# Patient Record
Sex: Female | Born: 1965 | Race: Black or African American | Hispanic: No | Marital: Single | Smoking: Current some day smoker
Health system: Southern US, Community
[De-identification: ages and names within clinical notes are randomized; demographics above are authoritative.]

## PROBLEM LIST (undated history)

## (undated) DIAGNOSIS — I10 Essential (primary) hypertension: Secondary | ICD-10-CM

---

## 2008-07-21 ENCOUNTER — Inpatient Hospital Stay: Payer: Self-pay | Admitting: Internal Medicine

## 2011-06-08 ENCOUNTER — Emergency Department: Payer: Self-pay | Admitting: *Deleted

## 2012-04-19 IMAGING — CR DG KNEE COMPLETE 4+V*L*
1 series · 5 of 5 positions shown · non-contrast
Comparison: none

REASON FOR EXAM: fall from ladder, left knee pain and swelling.
COMMENTS:

PROCEDURE:     DXR - DXR KNEE LT COMP WITH OBLIQUES  - June 08, 2011  [DATE]
RESULT:     Comparison: None.

[Series 4: t knee ap left · 0.14mm/px · 5 of 5 slices shown]
[im 1/5]
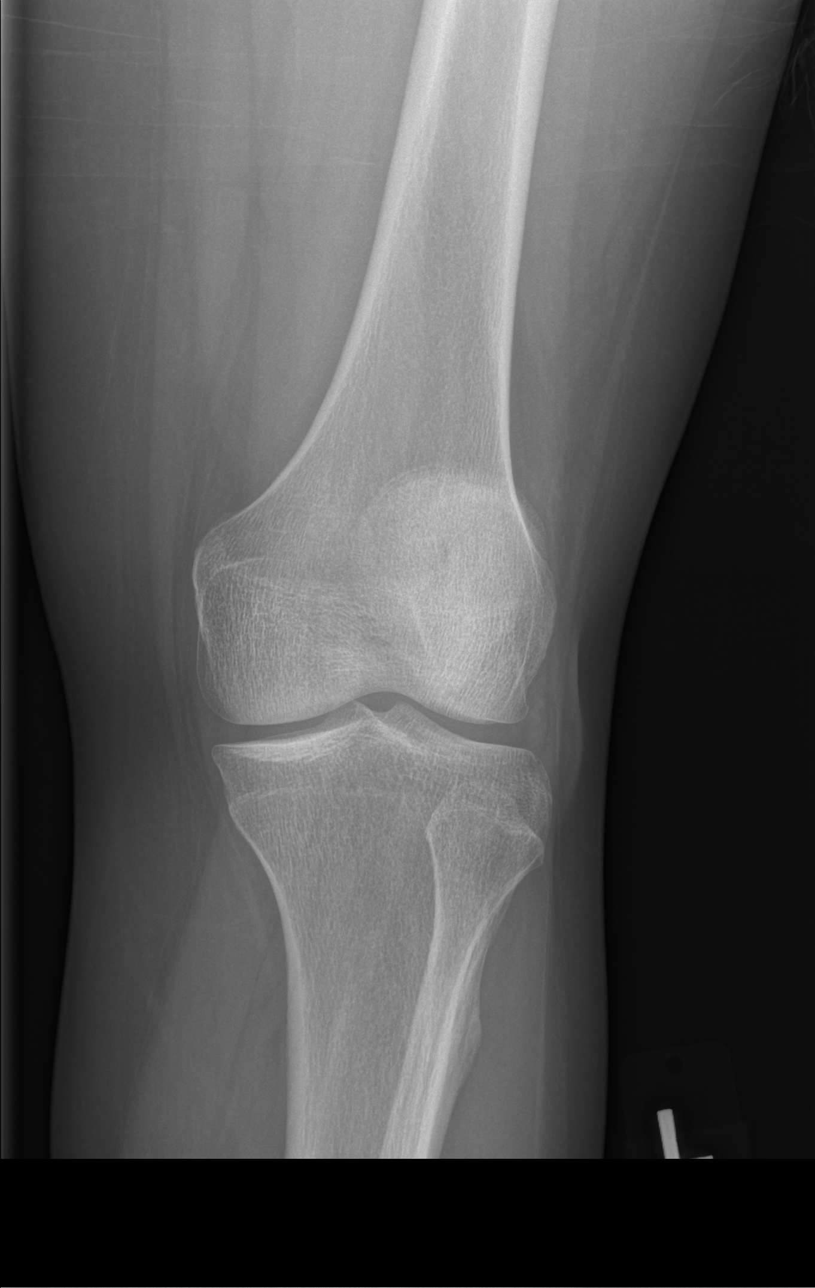
[im 2/5]
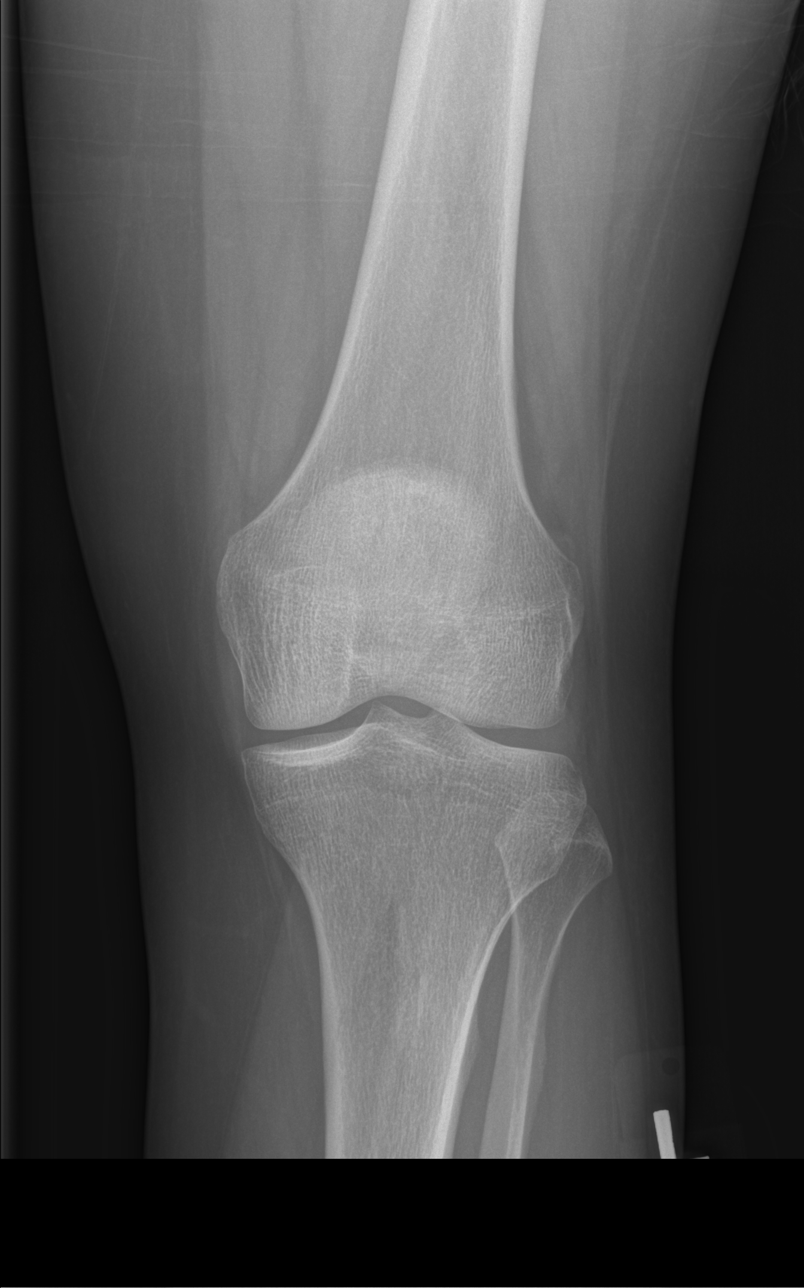
[im 3/5]
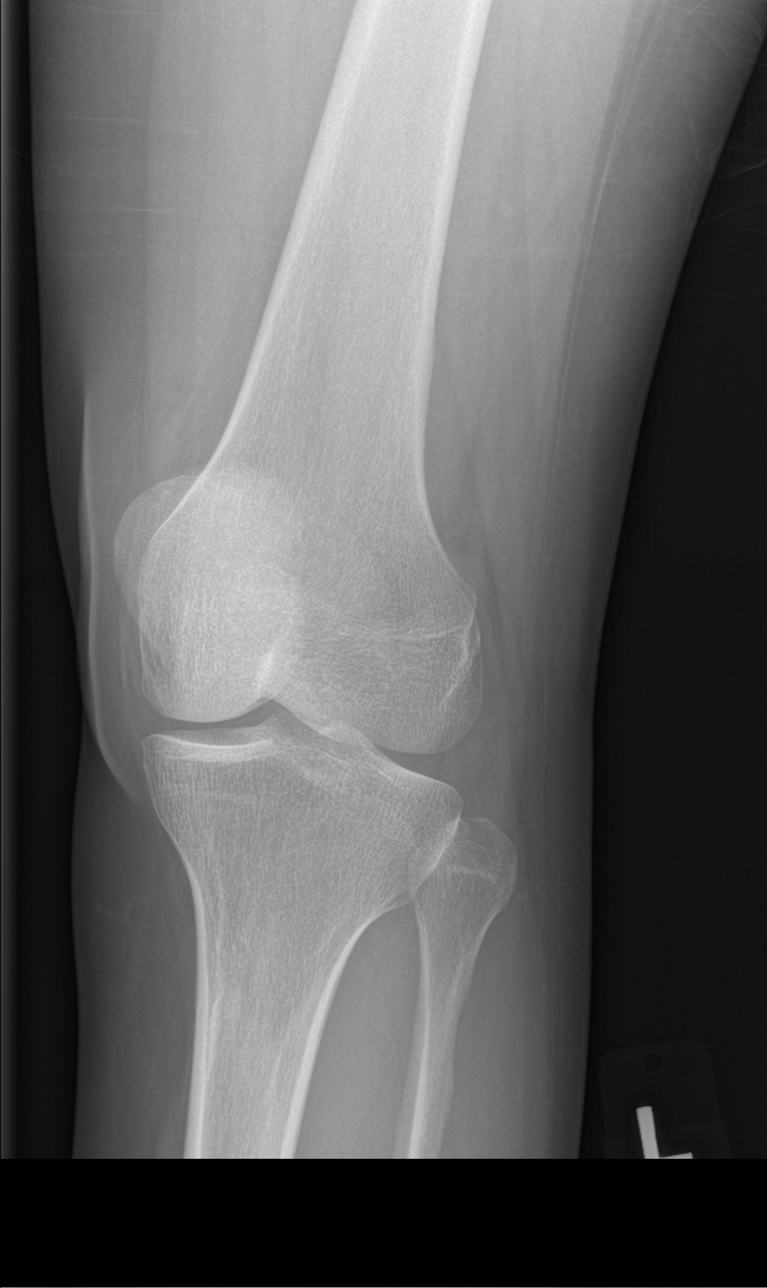
[im 4/5]
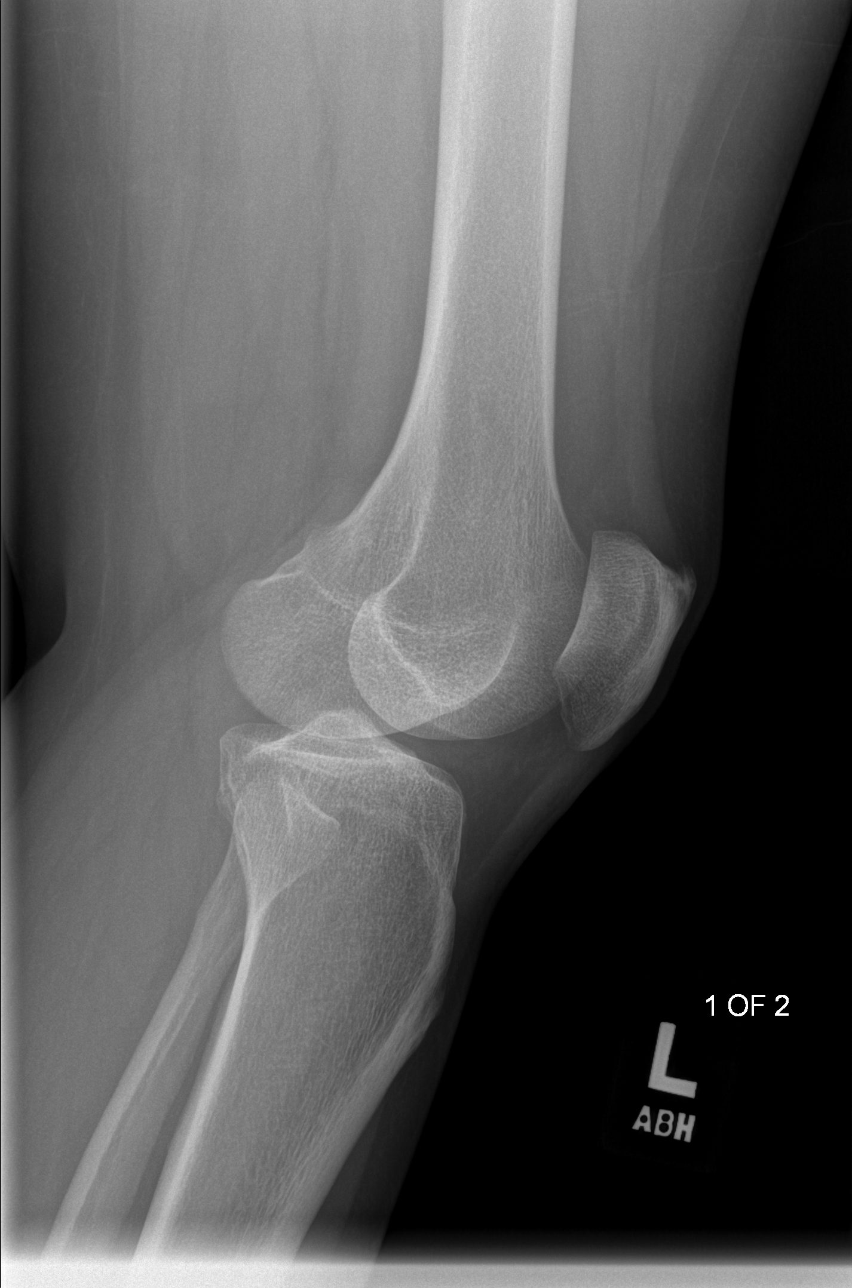
[im 5/5]
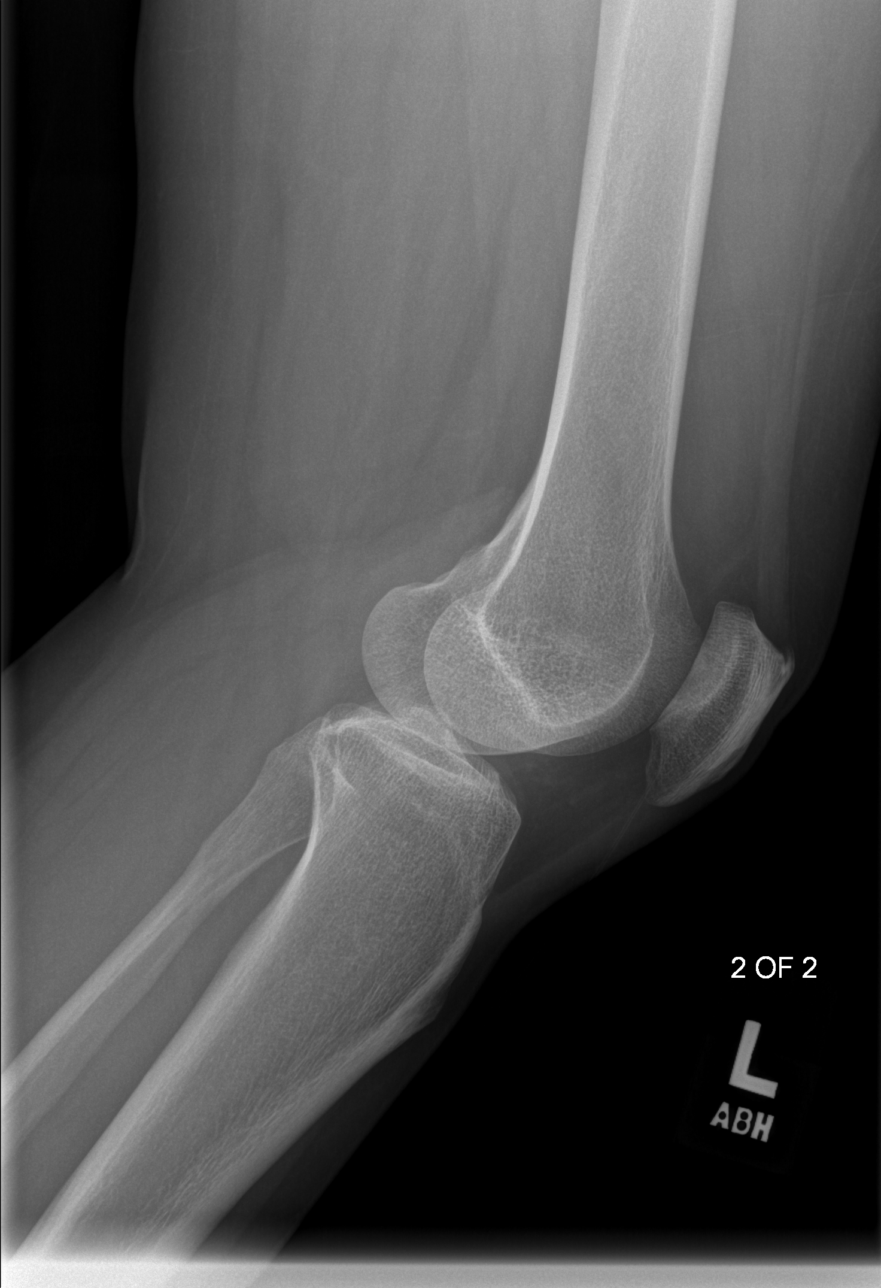

[5 of 5 positions shown; findings below may reference images not displayed]

FINDINGS: No acute fracture. Normal alignment. Joint spaces are maintained.
Indeterminate for effusion.
IMPRESSION: No fracture.

## 2015-10-07 ENCOUNTER — Emergency Department
Admission: EM | Admit: 2015-10-07 | Discharge: 2015-10-07 | Disposition: A | Discharge status: Home / Self Care | Payer: Self-pay | Attending: Emergency Medicine | Admitting: Emergency Medicine

## 2015-10-07 ENCOUNTER — Encounter: Payer: Self-pay | Admitting: Emergency Medicine

## 2015-10-07 ENCOUNTER — Emergency Department: Payer: Self-pay

## 2015-10-07 ENCOUNTER — Other Ambulatory Visit: Payer: Self-pay

## 2015-10-07 DIAGNOSIS — I10 Essential (primary) hypertension: Secondary | ICD-10-CM | POA: Insufficient documentation

## 2015-10-07 DIAGNOSIS — G629 Polyneuropathy, unspecified: Secondary | ICD-10-CM

## 2015-10-07 DIAGNOSIS — F17210 Nicotine dependence, cigarettes, uncomplicated: Secondary | ICD-10-CM | POA: Insufficient documentation

## 2015-10-07 DIAGNOSIS — G5692 Unspecified mononeuropathy of left upper limb: Secondary | ICD-10-CM | POA: Insufficient documentation

## 2015-10-07 HISTORY — DX: Essential (primary) hypertension: I10

## 2015-10-07 LAB — CBC
HCT: 44.7 % (ref 35.0–47.0)
Hemoglobin: 15.3 g/dL (ref 12.0–16.0)
MCH: 32 pg (ref 26.0–34.0)
MCHC: 34.3 g/dL (ref 32.0–36.0)
MCV: 93.3 fL (ref 80.0–100.0)
Platelets: 312 K/uL (ref 150–440)
RBC: 4.79 MIL/uL (ref 3.80–5.20)
RDW: 13.3 % (ref 11.5–14.5)
WBC: 10.3 K/uL (ref 3.6–11.0)

## 2015-10-07 LAB — COMPREHENSIVE METABOLIC PANEL
ALBUMIN: 4.5 g/dL (ref 3.5–5.0)
ALK PHOS: 96 U/L (ref 38–126)
ALT: 41 U/L (ref 14–54)
ANION GAP: 8 (ref 5–15)
AST: 39 U/L (ref 15–41)
BILIRUBIN TOTAL: 0.6 mg/dL (ref 0.3–1.2)
BUN: 15 mg/dL (ref 6–20)
CALCIUM: 9.5 mg/dL (ref 8.9–10.3)
CO2: 20 mmol/L — ABNORMAL LOW (ref 22–32)
Chloride: 111 mmol/L (ref 101–111)
Creatinine, Ser: 0.64 mg/dL (ref 0.44–1.00)
Glucose, Bld: 109 mg/dL — ABNORMAL HIGH (ref 65–99)
POTASSIUM: 3.9 mmol/L (ref 3.5–5.1)
Sodium: 139 mmol/L (ref 135–145)
TOTAL PROTEIN: 8.8 g/dL — AB (ref 6.5–8.1)

## 2015-10-07 NOTE — ED Provider Notes (Signed)
[facility] Emergency Department Provider Note  ____________________________________________    I have reviewed the triage vital signs and the nursing notes.   HISTORY  Chief Complaint Arm Pain    HPI Kayla Clarke is a 50 y.o. female who presents with complaints of tingling in her left arm which has now resolved. Apparently this occurred a few hours prior to arrival. Patient reports that she was sitting out in the sun drinking alcohol today as it is her first day off and she has had a lot of stress. She reports she felt very tired and had a tingling in her left arm. No chest pain. No weakness. In addition she complains of burning in her feet bilaterally, apparently this is been going on for some time. She denies headache. No history of diabetes     Past Medical History  Diagnosis Date  . Hypertension     There are no active problems to display for this patient.   History reviewed. No pertinent past surgical history.  No current outpatient prescriptions on file.  Allergies Review of patient's allergies indicates no known allergies.  No family history on file.  Social History Social History  Substance Use Topics  . Smoking status: Current Every Day Smoker -- 0.50 packs/day    Types: Cigarettes  . Smokeless tobacco: None  . Alcohol Use: 2.4 oz/week    4 Standard drinks or equivalent per week    Review of Systems  Constitutional: Positive for stress Eyes: Negative for change in vision ENT: Negative for sore throat Cardiovascular: Negative for chest pain Respiratory: Negative for shortness of breath. Gastrointestinal: Negative for abdominal pain Genitourinary: Negative for dysuria. Musculoskeletal: Chronic back pain Skin: Negative for rash. Neurological: Negative for focal weakness, no dizziness or headache Psychiatric: no anxiety    ____________________________________________   PHYSICAL EXAM:  VITAL SIGNS: ED Triage Vitals   Enc Vitals Group     BP 10/07/15 2018 162/90 mmHg     Pulse Rate 10/07/15 2018 79     Resp 10/07/15 2018 17     Temp 10/07/15 2018 97.8 F (36.6 C)     Temp Source 10/07/15 2018 Oral     SpO2 10/07/15 2018 99 %     Weight 10/07/15 2018 210 lb (95.255 kg)     Height 10/07/15 2018  (1.676 m)     Head Cir --      Peak Flow --      Pain Score 10/07/15 2028 5     Pain Loc --      Pain Edu? --      Excl. in GC? --     Constitutional: Alert and oriented. Well appearing and in no distress.  Eyes: Conjunctivae are normal. No erythema or injection, EOMI, PERRLA ENT   Head: Normocephalic and atraumatic.   Mouth/Throat: Mucous membranes are moist. Cardiovascular: Normal rate, regular rhythm. Normal and symmetric distal pulses are present in the upper extremities.  Respiratory: Normal respiratory effort without tachypnea nor retractions. Breath sounds are clear and equal bilaterally.  Gastrointestinal: Soft and non-tender in all quadrants. No distention. There is no CVA tenderness. Genitourinary: deferred Musculoskeletal: Nontender with normal range of motion in all extremities. No lower extremity tenderness nor edema. Neurologic:  Normal speech and language. No gross focal neurologic deficits are appreciated. Skin:  Skin is warm, dry and intact. No rash noted. Psychiatric: Mood and affect are normal. Patient exhibits appropriate insight and judgment.  ____________________________________________    LABS (pertinent positives/negatives)  Labs Reviewed  CBC  COMPREHENSIVE METABOLIC PANEL    ____________________________________________   EKG ED ECG REPORT I, Jene EveryKINNER, Avaneesh Pepitone, the attending physician, personally viewed and interpreted this ECG.  Date: 10/07/2015 EKG Time: 8:26 PM Rate: 83 Rhythm: normal sinus rhythm QRS Axis: normal Intervals: normal ST/T Wave abnormalities: normal Conduction Disturbances: none Narrative Interpretation:  unremarkable   ____________________________________________    RADIOLOGY  CT scan unremarkable  ____________________________________________   PROCEDURES  Procedure(s) performed: none  Critical Care performed:none  ____________________________________________   INITIAL IMPRESSION / ASSESSMENT AND PLAN / ED COURSE  Pertinent labs & imaging results that were available during my care of the patient were reviewed by me and considered in my medical decision making (see chart for details).  Patient presents in no acute distress. Her symptoms of all essentially resolved. History of present illness not consistent with CVA and exam is benign. She attributes this to stress and she may be correct. We will check labs, CT head and reevaluate  Patient is symptom-free and feels well. She is anxious to go home. Workup is unremarkable. Recommend follow-up with PCP, return to the ED for 6 symptoms ____________________________________________   FINAL CLINICAL IMPRESSION(S) / ED DIAGNOSES  Final diagnoses:  Neuropathy (HCC)                                                                                                               Jene Everyobert Anjelo Pullman, MD 10/07/15 2310

## 2015-10-07 NOTE — ED Notes (Signed)
MD at bedside. 

## 2015-10-07 NOTE — ED Notes (Signed)
Pt endorses increased life stress. Pt states she worked all day, without eating. Pt states she felt weak, L arm numbness, burning in bilateral feet. Pt states she suddenly felt very sleepy. Pt states she is presently very sleepy and her feet are continuing to burn.

## 2015-10-07 NOTE — ED Notes (Signed)
Patient reports that around 18:30 that she developed some numbness and tingling to her left arm. She reports that shortly after she developed tingling to bilateral feet. Patient reports that she often has the tingling in her feet after work. Patient with even smile, even grips and no drift noted in triage.

## 2015-10-07 NOTE — Discharge Instructions (Signed)

## 2022-06-29 ENCOUNTER — Emergency Department
Admission: EM | Admit: 2022-06-29 | Discharge: 2022-06-29 | Disposition: A | Discharge status: Home / Self Care | Payer: BC Managed Care – PPO | Attending: Emergency Medicine | Admitting: Emergency Medicine

## 2022-06-29 ENCOUNTER — Other Ambulatory Visit: Payer: Self-pay

## 2022-06-29 ENCOUNTER — Emergency Department: Payer: BC Managed Care – PPO

## 2022-06-29 DIAGNOSIS — Z20822 Contact with and (suspected) exposure to covid-19: Secondary | ICD-10-CM | POA: Insufficient documentation

## 2022-06-29 DIAGNOSIS — J101 Influenza due to other identified influenza virus with other respiratory manifestations: Secondary | ICD-10-CM | POA: Insufficient documentation

## 2022-06-29 DIAGNOSIS — I10 Essential (primary) hypertension: Secondary | ICD-10-CM | POA: Diagnosis not present

## 2022-06-29 DIAGNOSIS — R519 Headache, unspecified: Secondary | ICD-10-CM | POA: Diagnosis present

## 2022-06-29 DIAGNOSIS — R739 Hyperglycemia, unspecified: Secondary | ICD-10-CM

## 2022-06-29 LAB — CBC
HCT: 43 % (ref 36.0–46.0)
Hemoglobin: 14.5 g/dL (ref 12.0–15.0)
MCH: 31.3 pg (ref 26.0–34.0)
MCHC: 33.7 g/dL (ref 30.0–36.0)
MCV: 92.9 fL (ref 80.0–100.0)
Platelets: 201 10*3/uL (ref 150–400)
RBC: 4.63 MIL/uL (ref 3.87–5.11)
RDW: 11.4 % — ABNORMAL LOW (ref 11.5–15.5)
WBC: 5.2 10*3/uL (ref 4.0–10.5)
nRBC: 0 % (ref 0.0–0.2)

## 2022-06-29 LAB — GROUP A STREP BY PCR: Group A Strep by PCR: NOT DETECTED

## 2022-06-29 LAB — BASIC METABOLIC PANEL
Anion gap: 10 (ref 5–15)
BUN: 9 mg/dL (ref 6–20)
CO2: 19 mmol/L — ABNORMAL LOW (ref 22–32)
Calcium: 9.2 mg/dL (ref 8.9–10.3)
Chloride: 98 mmol/L (ref 98–111)
Creatinine, Ser: 0.58 mg/dL (ref 0.44–1.00)
GFR, Estimated: >60 mL/min (ref >60)
Glucose, Bld: 317 mg/dL — ABNORMAL HIGH (ref 70–99)
Potassium: 4.1 mmol/L (ref 3.5–5.1)
Sodium: 127 mmol/L — ABNORMAL LOW (ref 135–145)

## 2022-06-29 LAB — RESP PANEL BY RT-PCR (RSV, FLU A&B, COVID)  RVPGX2
Influenza A by PCR: POSITIVE — AB
Influenza B by PCR: NEGATIVE
Resp Syncytial Virus by PCR: NEGATIVE
SARS Coronavirus 2 by RT PCR: NEGATIVE

## 2022-06-29 MED ORDER — LACTATED RINGERS IV BOLUS
1000.0000 mL | Freq: Once | INTRAVENOUS | Status: AC
  Administered 2022-06-29: 1000 mL via INTRAVENOUS

## 2022-06-29 MED ORDER — ACETAMINOPHEN 500 MG PO TABS
1000.0000 mg | ORAL_TABLET | Freq: Once | ORAL | Status: AC
  Administered 2022-06-29: 1000 mg via ORAL
  Filled 2022-06-29: qty 2

## 2022-06-29 MED ORDER — DIPHENHYDRAMINE HCL 50 MG/ML IJ SOLN
25.0000 mg | Freq: Once | INTRAMUSCULAR | Status: AC
  Administered 2022-06-29: 25 mg via INTRAVENOUS
  Filled 2022-06-29: qty 1

## 2022-06-29 MED ORDER — PROCHLORPERAZINE EDISYLATE 10 MG/2ML IJ SOLN
10.0000 mg | Freq: Once | INTRAMUSCULAR | Status: AC
  Administered 2022-06-29: 10 mg via INTRAVENOUS
  Filled 2022-06-29: qty 2

## 2022-06-29 MED ORDER — AMLODIPINE BESYLATE 5 MG PO TABS: 5.0000 mg | ORAL_TABLET | Freq: Every day | ORAL | 2 refills | Status: AC

## 2022-06-29 MED ORDER — AMLODIPINE BESYLATE 5 MG PO TABS
5.0000 mg | ORAL_TABLET | Freq: Once | ORAL | Status: AC
  Administered 2022-06-29: 5 mg via ORAL
  Filled 2022-06-29: qty 1

## 2022-06-29 MED ORDER — IBUPROFEN 400 MG PO TABS
400.0000 mg | ORAL_TABLET | Freq: Once | ORAL | Status: AC
  Administered 2022-06-29: 400 mg via ORAL
  Filled 2022-06-29: qty 1

## 2022-06-29 NOTE — ED Triage Notes (Addendum)
Pt to ED via POV from home. Pt reports cough and HA since Thursday. Pt states daughter diagnosed with RSV and strep throat. Pt hypertensive in triage and states did not take BP meds.

## 2022-06-29 NOTE — ED Notes (Signed)
Pt to ED for flulike symptoms since 2-3 days. Body aches, HA.

## 2022-06-29 NOTE — ED Notes (Signed)
Pt up to bathroom. Steady gait noted.

## 2022-06-29 NOTE — ED Provider Notes (Signed)
[facility] Provider Note    Event Date/Time   First MD Initiated Contact with Patient 06/29/22 1125     (approximate)   History   Chief Complaint Cough and Headache   HPI  Kayla Clarke is a 56 y.o. female with past medical history of hypertension who presents to the ED complaining of cough and headache.  Patient reports that she has had about 5 days of nonproductive cough, headache, weakness, malaise, and nausea.  She is not aware of any fevers, does states she has been around her daughter, who had tested positive for strep and RSV.  She denies any chest pain or shortness of breath, has not had any vomiting or diarrhea.  She does report severe headache and bodyaches at this time.  She reports taking medicine for her blood pressure intermittently, has not seen a PCP in a long time.     Physical Exam   Triage Vital Signs: ED Triage Vitals  Enc Vitals Group     BP 06/29/22 0852 (!) 201/111     Pulse Rate 06/29/22 0851 (!) 140     Resp 06/29/22 0851 18     Temp 06/29/22 0851 99.1 F (37.3 C)     Temp Source 06/29/22 0851 Oral     SpO2 06/29/22 0851 96 %     Weight --      Height --      Head Circumference --      Peak Flow --      Pain Score 06/29/22 0849 8     Pain Loc --      Pain Edu? --      Excl. in GC? --     Most recent vital signs: Vitals:   06/29/22 1300 06/29/22 1330  BP: (!) 187/85 (!) 178/97  Pulse: (!) 120 (!) 121  Resp:  20  Temp:    SpO2: 93% 96%    Constitutional: Alert and oriented. Eyes: Conjunctivae are normal. Head: Atraumatic. Nose: No congestion/rhinnorhea. Mouth/Throat: Mucous membranes are moist.  Neck: Supple with no meningismus. Cardiovascular: Normal rate, regular rhythm. Grossly normal heart sounds.  2+ radial pulses bilaterally. Respiratory: Normal respiratory effort.  No retractions. Lungs CTAB. Gastrointestinal: Soft and nontender. No distention. Musculoskeletal: No lower extremity tenderness nor  edema.  Neurologic:  Normal speech and language. No gross focal neurologic deficits are appreciated.    ED Results / Procedures / Treatments   Labs (all labs ordered are listed, but only abnormal results are displayed) Labs Reviewed  RESP PANEL BY RT-PCR (RSV, FLU A&B, COVID)  RVPGX2 - Abnormal; Notable for the following components:      Result Value   Influenza A by PCR POSITIVE (*)    All other components within normal limits  CBC - Abnormal; Notable for the following components:   RDW 11.4 (*)    All other components within normal limits  BASIC METABOLIC PANEL - Abnormal; Notable for the following components:   Sodium 127 (*)    CO2 19 (*)    Glucose, Bld 317 (*)    All other components within normal limits  GROUP A STREP BY PCR     EKG  ED ECG REPORT I, Chesley Noon, the attending physician, personally viewed and interpreted this ECG.   Date: 06/29/2022  EKG Time: 8:56  Rate: 141  Rhythm: sinus tachycardia  Axis: Normal  Intervals:none  ST&T Change: Inferior T wave inversions  RADIOLOGY CT head reviewed and interpreted by me with  no hemorrhage or midline shift.  PROCEDURES:  Critical Care performed: No  Procedures   MEDICATIONS ORDERED IN ED: Medications  prochlorperazine (COMPAZINE) injection 10 mg (10 mg Intravenous Given 06/29/22 1218)  diphenhydrAMINE (BENADRYL) injection 25 mg (25 mg Intravenous Given 06/29/22 1218)  lactated ringers bolus 1,000 mL (0 mLs Intravenous Stopped 06/29/22 1422)  amLODipine (NORVASC) tablet 5 mg (5 mg Oral Given 06/29/22 1218)  ibuprofen (ADVIL) tablet 400 mg (400 mg Oral Given 06/29/22 1230)  lactated ringers bolus 1,000 mL (1,000 mLs Intravenous New Bag/Given 06/29/22 1419)     IMPRESSION / MDM / ASSESSMENT AND PLAN / ED COURSE  I reviewed the triage vital signs and the nursing notes.                              56 y.o. female with past medical history of hypertension who presents to the ED complaining of  cough, headache, malaise, and bodyaches for the past 5 days.  Patient's presentation is most consistent with acute presentation with potential threat to life or bodily function.  Differential diagnosis includes, but is not limited to, SAH, meningitis, tension headache, viral syndrome, influenza, COVID-19, electrolyte abnormality, AKI, uncontrolled hypertension.  Patient well-appearing and in no acute distress, vital signs remarkable for tachycardia, patient initially afebrile in triage but noted to have a temp of 103 on recheck.  Symptoms are likely due to influenza as patient has tested positive for flu A.  No findings concerning for meningitis on exam, CT head performed given her reported severe headache but is negative for acute process.  Patient does have elevated blood pressure, reports a history of hypertension with intermittent compliance with medication.  She has previously taken amlodipine and we will give a dose of this here in the ED, treat symptomatically with IV Compazine and Benadryl, treat fever with ibuprofen.  Labs are remarkable for mild hyperglycemia but no evidence of DKA, sodium 127 but corrects to 130 with compensation for hyperglycemia.  Plan to reassess following symptomatic management.  CT head negative for acute process, patient feeling much better on reassessment following migraine cocktail.  She did develop fever of 103, but this is also improved following dose of ibuprofen and appears to be due to known influenza.  Patient does remain slightly tachycardic and we will give additional IV fluid bolus.  Patient turned over to walk provider pending additional fluids and reassessment.      FINAL CLINICAL IMPRESSION(S) / ED DIAGNOSES   Final diagnoses:  Influenza A  Uncontrolled hypertension     Rx / DC Orders   ED Discharge Orders          Ordered    amLODipine (NORVASC) 5 MG tablet  Daily        06/29/22 1507             Note:  This document was prepared  using Dragon voice recognition software and may include unintentional dictation errors.   Chesley Noon, MD 06/29/22 [phone]

## 2022-06-29 NOTE — ED Provider Notes (Signed)
Received signout on this patient.  See original H&P for full history.  She is pending crystalloid bolus and recheck given persistent tachycardia.  She is flu positive.  Heart rate improved from 140 down to 110. Still slightly hypertensive. I reassessed her she is nontoxic-appearing comfortable.  Doubt meningitis or other emergent bacterial infections or sepsis at this time.  She is influenza positive and I reviewed at home treatment and follow-up with PMD.  I advised her to take to the emergency department if any worsening.  I doubt ACS or PE given no chest pain or shortness of breath and symptoms more consistent with influenza.     Pilar Jarvis, MD 06/29/22 [phone]

## 2022-06-29 NOTE — Discharge Instructions (Addendum)
Thank you for choosing [place] for your health care today!  Plenty of fluids to stay well-hydrated, find Pedialyte or similar electrolyte rehydration formulas at your local pharmacy.  Take acetaminophen 650 mg and ibuprofen 400 mg every 6 hours for fever and aches.  Take with food.   Please see your primary doctor this week for a follow up appointment.   Sometimes, in the early stages of certain disease courses it is difficult to detect in the emergency department evaluation -- so, it is important that you continue to monitor your symptoms and call your doctor right away or return to the emergency department if you develop any new or worsening symptoms.  Please go to the following website to schedule new (and existing) patient appointments:   [email or web address]  If you do not have a primary doctor try calling the following clinics to establish care:  If you have insurance:  [facility] [phone] [address] [place] [place] [address]   [facility]  [phone] [address] [place] [place] [address]   If you do not have insurance:  [facility]  [phone] [address] [place] [address]   The following is another list of primary care offices in the area who are accepting new patients at this time.  Please reach out to one of them directly and let them know you would like to schedule an appointment to follow up on an Emergency Department visit, and/or to establish a new primary care provider (PCP).  There are likely other primary care clinics in the are who are accepting new patients, but this is an excellent place to start:  [facility] Lead physician: Dr Shirlee Latch [address] [place], [place] [address] [phone]  [facility] Lead Physician: Dr Alba Cory [address], [place], [place] [address] [phone]  [facility]   Lead Physician: Dr Olevia Perches [address] [place], [place] [address] [phone]  [facility] Lead Physician: Dr Sofie Hartigan [address] [place], [place], [place] [address] [phone]  [facility] Lead Physician: Dr Bari Edward [address] [place], [place] [address] [phone]   It was my pleasure to care for you today.   Daneil Dan Modesto Charon, MD

## 2022-07-14 ENCOUNTER — Emergency Department: Payer: BC Managed Care – PPO

## 2022-07-14 ENCOUNTER — Other Ambulatory Visit: Payer: Self-pay

## 2022-07-14 ENCOUNTER — Emergency Department
Admission: EM | Admit: 2022-07-14 | Discharge: 2022-07-14 | Disposition: A | Discharge status: Home / Self Care | Payer: BC Managed Care – PPO | Attending: Emergency Medicine | Admitting: Emergency Medicine

## 2022-07-14 DIAGNOSIS — Z7982 Long term (current) use of aspirin: Secondary | ICD-10-CM | POA: Insufficient documentation

## 2022-07-14 DIAGNOSIS — I10 Essential (primary) hypertension: Secondary | ICD-10-CM | POA: Insufficient documentation

## 2022-07-14 DIAGNOSIS — Z79899 Other long term (current) drug therapy: Secondary | ICD-10-CM | POA: Diagnosis not present

## 2022-07-14 DIAGNOSIS — R739 Hyperglycemia, unspecified: Secondary | ICD-10-CM | POA: Diagnosis not present

## 2022-07-14 DIAGNOSIS — Z1152 Encounter for screening for COVID-19: Secondary | ICD-10-CM | POA: Diagnosis not present

## 2022-07-14 DIAGNOSIS — J189 Pneumonia, unspecified organism: Secondary | ICD-10-CM | POA: Insufficient documentation

## 2022-07-14 DIAGNOSIS — N390 Urinary tract infection, site not specified: Secondary | ICD-10-CM | POA: Diagnosis not present

## 2022-07-14 DIAGNOSIS — D72829 Elevated white blood cell count, unspecified: Secondary | ICD-10-CM | POA: Insufficient documentation

## 2022-07-14 DIAGNOSIS — R1030 Lower abdominal pain, unspecified: Secondary | ICD-10-CM | POA: Diagnosis present

## 2022-07-14 LAB — URINALYSIS, ROUTINE W REFLEX MICROSCOPIC
Bacteria, UA: NONE SEEN
Bilirubin Urine: NEGATIVE
Glucose, UA: >=500 mg/dL — AB
Ketones, ur: NEGATIVE mg/dL
Nitrite: NEGATIVE
Protein, ur: 100 mg/dL — AB
RBC / HPF: >50 RBC/hpf — ABNORMAL HIGH (ref 0–5)
Specific Gravity, Urine: 1.006 (ref 1.005–1.030)
WBC, UA: >50 WBC/hpf — ABNORMAL HIGH (ref 0–5)
pH: 5 (ref 5.0–8.0)

## 2022-07-14 LAB — CBC WITH DIFFERENTIAL/PLATELET
Abs Immature Granulocytes: 0.07 10*3/uL (ref 0.00–0.07)
Basophils Absolute: 0.1 10*3/uL (ref 0.0–0.1)
Basophils Relative: 0 %
Eosinophils Absolute: 0 10*3/uL (ref 0.0–0.5)
Eosinophils Relative: 0 %
HCT: 37.6 % (ref 36.0–46.0)
Hemoglobin: 13.3 g/dL (ref 12.0–15.0)
Immature Granulocytes: 0 %
Lymphocytes Relative: 16 %
Lymphs Abs: 2.5 10*3/uL (ref 0.7–4.0)
MCH: 32.8 pg (ref 26.0–34.0)
MCHC: 35.4 g/dL (ref 30.0–36.0)
MCV: 92.8 fL (ref 80.0–100.0)
Monocytes Absolute: 0.9 10*3/uL (ref 0.1–1.0)
Monocytes Relative: 6 %
Neutro Abs: 12.1 10*3/uL — ABNORMAL HIGH (ref 1.7–7.7)
Neutrophils Relative %: 78 %
Platelets: 493 10*3/uL — ABNORMAL HIGH (ref 150–400)
RBC: 4.05 MIL/uL (ref 3.87–5.11)
RDW: 11.1 % — ABNORMAL LOW (ref 11.5–15.5)
WBC: 15.6 10*3/uL — ABNORMAL HIGH (ref 4.0–10.5)
nRBC: 0 % (ref 0.0–0.2)

## 2022-07-14 LAB — COMPREHENSIVE METABOLIC PANEL
ALT: 38 U/L (ref 0–44)
AST: 29 U/L (ref 15–41)
Albumin: 3.4 g/dL — ABNORMAL LOW (ref 3.5–5.0)
Alkaline Phosphatase: 154 U/L — ABNORMAL HIGH (ref 38–126)
Anion gap: 12 (ref 5–15)
BUN: 12 mg/dL (ref 6–20)
CO2: 20 mmol/L — ABNORMAL LOW (ref 22–32)
Calcium: 9.4 mg/dL (ref 8.9–10.3)
Chloride: 100 mmol/L (ref 98–111)
Creatinine, Ser: 0.6 mg/dL (ref 0.44–1.00)
GFR, Estimated: >60 mL/min (ref >60)
Glucose, Bld: 342 mg/dL — ABNORMAL HIGH (ref 70–99)
Potassium: 4.1 mmol/L (ref 3.5–5.1)
Sodium: 132 mmol/L — ABNORMAL LOW (ref 135–145)
Total Bilirubin: 1.1 mg/dL (ref 0.3–1.2)
Total Protein: 8.9 g/dL — ABNORMAL HIGH (ref 6.5–8.1)

## 2022-07-14 LAB — RESP PANEL BY RT-PCR (RSV, FLU A&B, COVID)  RVPGX2
Influenza A by PCR: NEGATIVE
Influenza B by PCR: NEGATIVE
Resp Syncytial Virus by PCR: NEGATIVE
SARS Coronavirus 2 by RT PCR: NEGATIVE

## 2022-07-14 LAB — CBG MONITORING, ED: Glucose-Capillary: 330 mg/dL — ABNORMAL HIGH (ref 70–99)

## 2022-07-14 MED ORDER — METFORMIN HCL ER (OSM) 500 MG PO TB24: 500.0000 mg | ORAL_TABLET | Freq: Two times a day (BID) | ORAL | 1 refills | Status: AC

## 2022-07-14 MED ORDER — METFORMIN HCL 500 MG PO TABS
500.0000 mg | ORAL_TABLET | Freq: Once | ORAL | Status: AC
  Administered 2022-07-14: 500 mg via ORAL
  Filled 2022-07-14: qty 1

## 2022-07-14 MED ORDER — AZITHROMYCIN 250 MG PO TABS: ORAL_TABLET | ORAL | 0 refills | Status: AC

## 2022-07-14 MED ORDER — BENZONATATE 100 MG PO CAPS: 100.0000 mg | ORAL_CAPSULE | Freq: Three times a day (TID) | ORAL | 0 refills | Status: AC | PRN

## 2022-07-14 MED ORDER — CEFDINIR 300 MG PO CAPS: 300.0000 mg | ORAL_CAPSULE | Freq: Two times a day (BID) | ORAL | 0 refills | Status: AC

## 2022-07-14 MED ORDER — CEFDINIR 300 MG PO CAPS
300.0000 mg | ORAL_CAPSULE | Freq: Two times a day (BID) | ORAL | Status: DC
  Administered 2022-07-14: 300 mg via ORAL
  Filled 2022-07-14: qty 1

## 2022-07-14 MED ORDER — SODIUM CHLORIDE 0.9 % IV BOLUS (SEPSIS)
1000.0000 mL | Freq: Once | INTRAVENOUS | Status: AC
  Administered 2022-07-14: 1000 mL via INTRAVENOUS

## 2022-07-14 MED ORDER — PHENAZOPYRIDINE HCL 95 MG PO TABS: 95.0000 mg | ORAL_TABLET | Freq: Three times a day (TID) | ORAL | 0 refills | Status: AC | PRN

## 2022-07-14 MED ORDER — ACETAMINOPHEN 500 MG PO TABS
1000.0000 mg | ORAL_TABLET | Freq: Once | ORAL | Status: AC
  Administered 2022-07-14: 1000 mg via ORAL
  Filled 2022-07-14: qty 2

## 2022-07-14 MED ORDER — AZITHROMYCIN 500 MG PO TABS
500.0000 mg | ORAL_TABLET | Freq: Once | ORAL | Status: AC
  Administered 2022-07-14: 500 mg via ORAL
  Filled 2022-07-14: qty 1

## 2022-07-14 NOTE — Discharge Instructions (Addendum)
You may alternate Tylenol 1000 mg every 6 hours as needed for pain, fever and Ibuprofen 800 mg every 6-8 hours as needed for pain, fever.  Please take Ibuprofen with food.  Do not take more than 4000 mg of Tylenol (acetaminophen) in a 24 hour period.   Please go to the following website to schedule new (and existing) patient appointments:   [email or web address]   The following is a list of primary care offices in the area who are accepting new patients at this time.  Please reach out to one of them directly and let them know you would like to schedule an appointment to follow up on an Emergency Department visit, and/or to establish a new primary care provider (PCP).  There are likely other primary care clinics in the are who are accepting new patients, but this is an excellent place to start:  [facility] Lead physician: Dr Angela Bacigalupo [address] [place], [place] [address] [phone]  [facility] Lead Physician: Dr Krichna Sowles [address], [place], [place] [address] [phone]  [facility]  Lead Physician: Dr Megan Johnson [address], [place], [place] [address] [phone]  [facility] Lead Physician: Dr Alex Karamalegos [address], [place], [place] [address] [phone]  [facility] Lead Physician: Dr Laura Berglund [address], [place], [place] [address] [phone]  

## 2022-07-14 NOTE — ED Notes (Signed)
Pt asking to discharge home. States pain is better and would like d/c papers.

## 2022-07-14 NOTE — ED Notes (Signed)
Pt ambulated around nurses station. Stats maintained 96-97% . No SOB/CP. Pt HR remainsed 110-117s. EDP informed

## 2022-07-14 NOTE — ED Provider Notes (Signed)
[facility] Provider Note    Event Date/Time   First MD Initiated Contact with Patient 07/14/22 [phone]     (approximate)   History   Abdominal Pain   HPI  Kayla Clarke is a 57 y.o. female with history of hypertension who presents to the emergency department with complaints of suprapubic discomfort, dysuria.  No flank pain, vomiting or diarrhea.  She also reports that she had influenza several weeks ago and has persistent cough since that time.  No chest pain or shortness of breath.   History provided by patient.    Past Medical History:  Diagnosis Date   Hypertension     History reviewed. No pertinent surgical history.  MEDICATIONS:  Prior to Admission medications   Medication Sig Start Date End Date Taking? Authorizing Provider  amLODipine (NORVASC) 5 MG tablet Take 1 tablet (5 mg total) by mouth daily. 06/29/22 09/27/22  Chesley Noon, MD  Aspirin-Salicylamide-Caffeine (BC HEADACHE POWDER PO) Take 1 Package by mouth as needed.    [provider]  naproxen sodium (ANAPROX) 220 MG tablet Take 220 mg by mouth 2 (two) times daily with a meal.    [provider]    Physical Exam   Triage Vital Signs: ED Triage Vitals  Enc Vitals Group     BP 07/14/22 0232 (!) 187/112     Pulse Rate 07/14/22 0232 (!) 130     Resp 07/14/22 0232 (!) 21     Temp 07/14/22 0232 98.9 F (37.2 C)     Temp Source 07/14/22 0232 Oral     SpO2 07/14/22 0232 97 %     Weight 07/14/22 0234 158 lb (71.7 kg)     Height 07/14/22 0234 5\' 4"  (1.626 m)     Head Circumference --      Peak Flow --      Pain Score 07/14/22 0233 10     Pain Loc --      Pain Edu? --      Excl. in GC? --     Most recent vital signs: Vitals:   07/14/22 0624 07/14/22 0646  BP:    Pulse: (!) 112 (!) 107  Resp:    Temp:    SpO2:      CONSTITUTIONAL: Alert and oriented and responds appropriately to questions. Well-appearing; well-nourished HEAD: Normocephalic,  atraumatic EYES: Conjunctivae clear, pupils appear equal, sclera nonicteric ENT: normal nose; moist mucous membranes NECK: Supple, normal ROM CARD: Regular and tachycardic; S1 and S2 appreciated; no murmurs, no clicks, no rubs, no gallops RESP: Normal chest excursion without splinting or tachypnea; breath sounds clear and equal bilaterally; no wheezes, no rhonchi, no rales, no hypoxia or respiratory distress, speaking full sentences ABD/GI: Normal bowel sounds; non-distended; soft, non-tender, no rebound, no guarding, no peritoneal signs BACK: The back appears normal EXT: Normal ROM in all joints; no deformity noted, no edema; no cyanosis SKIN: Normal color for age and race; warm; no rash on exposed skin NEURO: Moves all extremities equally, normal speech PSYCH: The patient's mood and manner are appropriate.   ED Results / Procedures / Treatments   LABS: (all labs ordered are listed, but only abnormal results are displayed) Labs Reviewed  URINALYSIS, ROUTINE W REFLEX MICROSCOPIC - Abnormal; Notable for the following components:      Result Value   Color, Urine YELLOW (*)    APPearance CLOUDY (*)    Glucose, UA >=500 (*)    Hgb urine dipstick LARGE (*)  Protein, ur 100 (*)    Leukocytes,Ua LARGE (*)    RBC / HPF >50 (*)    WBC, UA >50 (*)    All other components within normal limits  CBC WITH DIFFERENTIAL/PLATELET - Abnormal; Notable for the following components:   WBC 15.6 (*)    RDW 11.1 (*)    Platelets 493 (*)    Neutro Abs 12.1 (*)    All other components within normal limits  COMPREHENSIVE METABOLIC PANEL - Abnormal; Notable for the following components:   Sodium 132 (*)    CO2 20 (*)    Glucose, Bld 342 (*)    Total Protein 8.9 (*)    Albumin 3.4 (*)    Alkaline Phosphatase 154 (*)    All other components within normal limits  RESP PANEL BY RT-PCR (RSV, FLU A&B, COVID)  [id number]  URINE CULTURE     EKG:   RADIOLOGY: My personal review and interpretation of  imaging: Chest x-ray shows multifocal pneumonia.  CT scan shows cystitis.  I have personally reviewed all radiology reports.   DG Chest 2 View  Result Date: 07/14/2022 CLINICAL DATA:  Multifocal pneumonia. EXAM: CHEST - 2 VIEW COMPARISON:  07/21/2008 FINDINGS: Patchy airspace disease is seen in the left lung base with subtle airspace opacity in the right upper lobe. No pulmonary edema or pleural effusion. Cardiopericardial silhouette is at upper limits of normal for size. The visualized bony structures of the thorax are unremarkable. IMPRESSION: Patchy airspace disease in the left lung base and right upper lobe compatible with multifocal pneumonia. Electronically Signed   By: Kennith Center M.D.   On: 07/14/2022 05:25   CT Renal Stone Study  Result Date: 07/14/2022 CLINICAL DATA:  Abdominal/flank pain, stone suspected. Mid abdominal pain, urinary urgency, dysuria EXAM: CT ABDOMEN AND PELVIS WITHOUT CONTRAST TECHNIQUE: Multidetector CT imaging of the abdomen and pelvis was performed following the standard protocol without IV contrast. RADIATION DOSE REDUCTION: This exam was performed according to the departmental dose-optimization program which includes automated exposure control, adjustment of the mA and/or kV according to patient size and/or use of iterative reconstruction technique. COMPARISON:  None Available. FINDINGS: Lower chest: Mild bibasilar scattered peribronchial nodular infiltrates are seen, more prevalent within the lingula, in keeping with multifocal infection or aspiration. No pleural effusion. Visualized heart and pericardium are unremarkable. Hepatobiliary: No focal liver abnormality is seen. No gallstones, gallbladder wall thickening, or biliary dilatation. Pancreas: Unremarkable Spleen: Unremarkable Adrenals/Urinary Tract: Adrenal glands are unremarkable. Kidneys are normal, without renal calculi, focal lesion, or hydronephrosis. The bladder is largely decompressed. Despite this, there is  circumferential bladder wall thickening and mild perivesicular inflammatory stranding suggesting a superimposed infectious or inflammatory cystitis. No perivesicular fluid collections are identified. Stomach/Bowel: Stomach, small bowel, and large bowel are unremarkable save for mild descending and sigmoid colonic diverticulosis. Appendix normal. No free intraperitoneal gas or fluid. Vascular/Lymphatic: Aortic atherosclerosis. No enlarged abdominal or pelvic lymph nodes. Reproductive: Uterus and bilateral adnexa are unremarkable. Other: No abdominal wall hernia or abnormality. No abdominopelvic ascites. Musculoskeletal: No acute bone abnormality. No lytic or blastic bone lesion. IMPRESSION: 1. Circumferential bladder wall thickening and mild perivesicular inflammatory stranding suggesting a superimposed infectious or inflammatory cystitis. Correlation with urinalysis and urine culture may be helpful. 2. Mild bibasilar scattered peribronchial nodular infiltrates, more prevalent within the lingula, in keeping with multifocal infection or aspiration. 3. Mild distal colonic diverticulosis without superimposed acute inflammatory change. 4. Aortic atherosclerosis. Aortic Atherosclerosis (ICD10-I70.0). Electronically Signed   By: Gloris Ham  Christa See M.D.   On: 07/14/2022 04:04     PROCEDURES:  Critical Care performed: No   .1-3 Lead EKG Interpretation  Performed by: Marlean Mortell, Delice Bison, DO Authorized by: Omeed Osuna, Delice Bison, DO     Interpretation: abnormal     ECG rate:  114   ECG rate assessment: tachycardic     Rhythm: sinus tachycardia     Ectopy: none     Conduction: normal       IMPRESSION / MDM / ASSESSMENT AND PLAN / ED COURSE  I reviewed the triage vital signs and the nursing notes.    Patient here with suprapubic discomfort, dysuria and also with cough.  The patient is on the cardiac monitor to evaluate for evidence of arrhythmia and/or significant heart rate changes.   DIFFERENTIAL DIAGNOSIS  (includes but not limited to):   UTI, kidney stone, pyelonephritis, pneumonia, viral URI   Patient's presentation is most consistent with acute complicated illness / injury requiring diagnostic workup.   PLAN: Patient's labs show leukocytosis of 15,000 with left shift.  COVID, flu and RSV negative.  She does have a UTI.  Culture pending.  She also has some hyperglycemia and bicarb is slightly low but normal anion gap and no ketones to suggest DKA.  She has no known history of diabetes but it appears she has been hyperglycemic on lab work previously.  Will start her on metformin and give IV fluids here.  Chest x-ray reviewed and interpreted by myself and the radiologist and shows multifocal pneumonia.  CT renal study shows cystitis without pyelonephritis or kidney stone.  She has no hypoxia at rest or with ambulation but is slightly tachycardic but afebrile with normal blood pressures.  She would like to go home if possible.  Will give antibiotics to cover for both UTI and immunity acquired pneumonia here.   MEDICATIONS GIVEN IN ED: Medications  cefdinir (OMNICEF) capsule 300 mg (300 mg Oral Given 07/14/22 0612)  sodium chloride 0.9 % bolus 1,000 mL (1,000 mLs Intravenous New Bag/Given 07/14/22 0640)  azithromycin (ZITHROMAX) tablet 500 mg (500 mg Oral Given 07/14/22 0612)  acetaminophen (TYLENOL) tablet 1,000 mg (1,000 mg Oral Given 07/14/22 0612)  metFORMIN (GLUCOPHAGE) tablet 500 mg (500 mg Oral Given 07/14/22 0640)     ED COURSE: Patient's heart rate improving with IV fluids.  I feel she is safe to be discharged home for outpatient treatment.   At this time, I do not feel there is any life-threatening condition present. I reviewed all nursing notes, vitals, pertinent previous records.  All lab and urine results, EKGs, imaging ordered have been independently reviewed and interpreted by myself.  I reviewed all available radiology reports from any imaging ordered this visit.  Based on my assessment, I  feel the patient is safe to be discharged home without further emergent workup and can continue workup as an outpatient as needed. Discussed all findings, treatment plan as well as usual and customary return precautions.  They verbalize understanding and are comfortable with this plan.  Outpatient follow-up has been provided as needed.  All questions have been answered.    CONSULTS: Admission considered but patient is not hypoxic and does not appear toxic.  She is comfortable with plan for management as an outpatient with oral antibiotics.  Will give her PCP follow-up.   OUTSIDE RECORDS REVIEWED: No previous records for review.       FINAL CLINICAL IMPRESSION(S) / ED DIAGNOSES   Final diagnoses:  Multifocal pneumonia  Acute UTI  Hyperglycemia     Rx / DC Orders   ED Discharge Orders          Ordered    cefdinir (OMNICEF) 300 MG capsule  2 times daily        07/14/22 0616    azithromycin (ZITHROMAX Z-PAK) 250 MG tablet        07/14/22 0616    benzonatate (TESSALON PERLES) 100 MG capsule  3 times daily PRN        07/14/22 0616    phenazopyridine (PYRIDIUM) 95 MG tablet  3 times daily PRN        07/14/22 0616    metformin (FORTAMET) 500 MG (OSM) 24 hr tablet  2 times daily with meals        07/14/22 0630             Note:  This document was prepared using Dragon voice recognition software and may include unintentional dictation errors.   Ayodeji Keimig, Layla Maw, DO 07/14/22 [phone]

## 2022-07-14 NOTE — ED Triage Notes (Signed)
Pt arrives via POV with CC of lower middle abd pain. Reports feeling of urgency with dysuria and little urine output. Reports nausea. Denies diarrhea.

## 2022-07-16 LAB — URINE CULTURE: Culture: >=100000 — AB

## 2022-11-10 ENCOUNTER — Emergency Department
Admission: EM | Admit: 2022-11-10 | Discharge: 2022-11-10 | Disposition: A | Discharge status: Home / Self Care | Payer: BC Managed Care – PPO | Attending: Emergency Medicine | Admitting: Emergency Medicine

## 2022-11-10 ENCOUNTER — Emergency Department: Payer: BC Managed Care – PPO

## 2022-11-10 ENCOUNTER — Other Ambulatory Visit: Payer: Self-pay

## 2022-11-10 DIAGNOSIS — E871 Hypo-osmolality and hyponatremia: Secondary | ICD-10-CM | POA: Diagnosis not present

## 2022-11-10 DIAGNOSIS — J209 Acute bronchitis, unspecified: Secondary | ICD-10-CM | POA: Diagnosis not present

## 2022-11-10 DIAGNOSIS — Z1152 Encounter for screening for COVID-19: Secondary | ICD-10-CM | POA: Insufficient documentation

## 2022-11-10 DIAGNOSIS — I10 Essential (primary) hypertension: Secondary | ICD-10-CM | POA: Insufficient documentation

## 2022-11-10 DIAGNOSIS — R059 Cough, unspecified: Secondary | ICD-10-CM | POA: Diagnosis present

## 2022-11-10 LAB — COMPREHENSIVE METABOLIC PANEL
ALT: 36 U/L (ref 0–44)
AST: 32 U/L (ref 15–41)
Albumin: 3.9 g/dL (ref 3.5–5.0)
Alkaline Phosphatase: 98 U/L (ref 38–126)
Anion gap: 8 (ref 5–15)
BUN: 12 mg/dL (ref 6–20)
CO2: 22 mmol/L (ref 22–32)
Calcium: 8.9 mg/dL (ref 8.9–10.3)
Chloride: 97 mmol/L — ABNORMAL LOW (ref 98–111)
Creatinine, Ser: 0.65 mg/dL (ref 0.44–1.00)
GFR, Estimated: >60 mL/min (ref >60)
Glucose, Bld: 212 mg/dL — ABNORMAL HIGH (ref 70–99)
Potassium: 3.8 mmol/L (ref 3.5–5.1)
Sodium: 127 mmol/L — ABNORMAL LOW (ref 135–145)
Total Bilirubin: 1.1 mg/dL (ref 0.3–1.2)
Total Protein: 8.3 g/dL — ABNORMAL HIGH (ref 6.5–8.1)

## 2022-11-10 LAB — CBC
HCT: 40.6 % (ref 36.0–46.0)
Hemoglobin: 14.1 g/dL (ref 12.0–15.0)
MCH: 32.4 pg (ref 26.0–34.0)
MCHC: 34.7 g/dL (ref 30.0–36.0)
MCV: 93.3 fL (ref 80.0–100.0)
Platelets: 257 10*3/uL (ref 150–400)
RBC: 4.35 MIL/uL (ref 3.87–5.11)
RDW: 11.5 % (ref 11.5–15.5)
WBC: 7.8 10*3/uL (ref 4.0–10.5)
nRBC: 0 % (ref 0.0–0.2)

## 2022-11-10 LAB — URINALYSIS, ROUTINE W REFLEX MICROSCOPIC
Bilirubin Urine: NEGATIVE
Glucose, UA: NEGATIVE mg/dL
Ketones, ur: NEGATIVE mg/dL
Leukocytes,Ua: NEGATIVE
Nitrite: NEGATIVE
Protein, ur: NEGATIVE mg/dL
Specific Gravity, Urine: 1.003 — ABNORMAL LOW (ref 1.005–1.030)
pH: 6 (ref 5.0–8.0)

## 2022-11-10 LAB — RESP PANEL BY RT-PCR (FLU A&B, COVID) ARPGX2
Influenza A by PCR: NEGATIVE
Influenza B by PCR: NEGATIVE
SARS Coronavirus 2 by RT PCR: NEGATIVE

## 2022-11-10 LAB — LACTIC ACID, PLASMA: Lactic Acid, Venous: 1 mmol/L (ref 0.5–1.9)

## 2022-11-10 MED ORDER — ALBUTEROL SULFATE HFA 108 (90 BASE) MCG/ACT IN AERS: 2.0000 | INHALATION_SPRAY | RESPIRATORY_TRACT | 0 refills | Status: AC | PRN

## 2022-11-10 MED ORDER — IBUPROFEN 600 MG PO TABS: 600.0000 mg | ORAL_TABLET | Freq: Four times a day (QID) | ORAL | 0 refills | Status: AC | PRN

## 2022-11-10 MED ORDER — SODIUM CHLORIDE 0.9 % IV BOLUS
1000.0000 mL | Freq: Once | INTRAVENOUS | Status: AC
  Administered 2022-11-10: 1000 mL via INTRAVENOUS

## 2022-11-10 MED ORDER — AZITHROMYCIN 250 MG PO TABS: ORAL_TABLET | ORAL | 0 refills | Status: AC

## 2022-11-10 MED ORDER — ACETAMINOPHEN 500 MG PO TABS
1000.0000 mg | ORAL_TABLET | ORAL | Status: AC
  Administered 2022-11-10: 1000 mg via ORAL
  Filled 2022-11-10: qty 2

## 2022-11-10 NOTE — ED Provider Notes (Signed)
[facility] Provider Note    Event Date/Time   First MD Initiated Contact with Patient 11/10/22 [phone]     (approximate)   History   Generalized Body Aches   HPI  Kayla Clarke is a 57 y.o. female with a history of hypertension who presents with bodyaches and chills for the last few days, persistent course, associated with fever as well as with cough.  The patient denies any shortness of breath.  She reports nausea but no vomiting.  She has not had any abdominal pain or diarrhea and denies any urinary symptoms.  She states that the symptoms feel similar to when she had the flu previously a few months ago.  I reviewed the past medical records.  The patient's only recent prior encounters here are in the ED on 1/7 for dysuria and 12/23 of last year for cough and headache.   Physical Exam   Triage Vital Signs: ED Triage Vitals  Enc Vitals Group     BP 11/10/22 0625 (!) 180/166     Pulse Rate 11/10/22 0625 (!) 137     Resp 11/10/22 0625 19     Temp 11/10/22 0625 (!) 102.7 F (39.3 C)     Temp Source 11/10/22 0621 Oral     SpO2 11/10/22 0625 97 %     Weight 11/10/22 0622 168 lb 3.4 oz (76.3 kg)     Height 11/10/22 0622 5\' 4"  (1.626 m)     Head Circumference --      Peak Flow --      Pain Score 11/10/22 0622 0     Pain Loc --      Pain Edu? --      Excl. in GC? --     Most recent vital signs: Vitals:   11/10/22 0900 11/10/22 0930  BP: 136/81 (!) 141/81  Pulse: (!) 103 93  Resp:    Temp:    SpO2: 98% 97%     General: Alert, well-appearing, no distress.  CV:  Good peripheral perfusion.  Resp:  Lungs CTAB.  Normal effort.  Abd:  No distention.  Other:  Oropharynx clear.   ED Results / Procedures / Treatments   Labs (all labs ordered are listed, but only abnormal results are displayed) Labs Reviewed  COMPREHENSIVE METABOLIC PANEL - Abnormal; Notable for the following components:      Result Value   Sodium 127 (*)    Chloride 97 (*)     Glucose, Bld 212 (*)    Total Protein 8.3 (*)    All other components within normal limits  URINALYSIS, ROUTINE W REFLEX MICROSCOPIC - Abnormal; Notable for the following components:   Color, Urine STRAW (*)    APPearance CLEAR (*)    Specific Gravity, Urine 1.003 (*)    Hgb urine dipstick SMALL (*)    Bacteria, UA MANY (*)    All other components within normal limits  RESP PANEL BY RT-PCR (FLU A&B, COVID) [id number]  CBC  LACTIC ACID, PLASMA     EKG     RADIOLOGY  Chest x-ray: I independently viewed and interpreted the images; there are faint opacities in the right upper lobe but no focal consolidation or edema  CT chest:   IMPRESSION:  1. No lung nodules are seen. The finding on the chest x-rays is  likely some sort of summation artifact of overlying structures.  2. Minimal emphysematous change in the lung apices.  3. Lower lobe bronchitis without bronchiectasis  or bronchial  plugging.  4. Trace aortic and single-vessel coronary artery atherosclerosis.  5. Hepatic steatosis.  6. Asymmetric eventration and elevation of the right hemidiaphragm.  7. Thoracic kyphosis and mild spondylosis.    Emphysema (ICD10-J43.9).    PROCEDURES:  Critical Care performed: No  Procedures   MEDICATIONS ORDERED IN ED: Medications  acetaminophen (TYLENOL) tablet 1,000 mg (1,000 mg Oral Given 11/10/22 0635)  sodium chloride 0.9 % bolus 1,000 mL (0 mLs Intravenous Stopped 11/10/22 0905)     IMPRESSION / MDM / ASSESSMENT AND PLAN / ED COURSE  I reviewed the triage vital signs and the nursing notes.  57 year old female with PMH as noted above presents with fever, body aches, chills and some cough over the last few days.  On exam she is febrile and tachycardic but overall well-appearing.  Chest x-ray shows possible faint opacities in the right upper lung.  Differential diagnosis includes, but is not limited to, pneumonia, COVID-19, influenza, other viral syndrome, less likely UTI or other  source of infection.  We will obtain CT chest to further evaluate the x-ray findings, lab workup, give fluids and antipyretics and reassess.  Patient's presentation is most consistent with acute complicated illness / injury requiring diagnostic workup.  The patient is on the cardiac monitor to evaluate for evidence of arrhythmia and/or significant heart rate changes.  ----------------------------------------- 9:48 AM on 11/10/2022 -----------------------------------------  CT chest shows no evidence of pneumonia or any nodules.  The findings on x-ray are likely artifactual.  However there are findings of acute bronchitis on the CT.  This is consistent with the patient's clinical presentation.  Lab workup significant for mild hyponatremia which the patient has had previously.  She has been given a liter of normal saline to correct this.  Respiratory panel is negative.  CBC shows no leukocytosis.  Urinalysis shows some bacteria but no WBCs or other findings of UTI.  Lactate is normal.  On reassessment the patient appears comfortable.  Her vital signs have normalized.  Overall presentation is consistent with acute bronchitis.  This may be viral, however given the presence of the fever and the findings on CT we will treat with empiric antibiotics.  I did initially consider whether the patient may require inpatient admission due to her tachycardia however given that the vital signs have normalized and the workup is overall reassuring, she is appropriate for outpatient treatment.  The patient feels well and is comfortable going home.  I counseled her on the results of the workup and plan of care.  I gave strict return precautions and she expressed understanding.  FINAL CLINICAL IMPRESSION(S) / ED DIAGNOSES   Final diagnoses:  Acute bronchitis, unspecified organism     Rx / DC Orders   ED Discharge Orders          Ordered    azithromycin (ZITHROMAX Z-PAK) 250 MG tablet        11/10/22 0947     albuterol (VENTOLIN HFA) 108 (90 Base) MCG/ACT inhaler  Every 4 hours PRN        11/10/22 0947    ibuprofen (ADVIL) 600 MG tablet  Every 6 hours PRN        11/10/22 0947             Note:  This document was prepared using Dragon voice recognition software and may include unintentional dictation errors.    Dionne Bucy, MD 11/10/22 [phone]

## 2022-11-10 NOTE — ED Notes (Signed)
Patient transported to CT 

## 2022-11-10 NOTE — ED Triage Notes (Addendum)
Pt arrives via POV with CC of body aches, chills, fever, cough, and headache since Thursday (three days ago). Pt appears to be in NAD at this time.

## 2022-11-10 NOTE — Discharge Instructions (Addendum)
Take the antibiotic as prescribed and finish the full course.  Take ibuprofen as needed for fever or bodyaches.  Make sure to drink plenty of fluids.  You may use the albuterol inhaler as needed for shortness of breath or tightness in your chest.  Return to the ER for new, worsening, or persistent severe fever, weakness, shortness of breath, headache, vomiting, inability to swallow or take the medication, or any other new or worsening symptoms that concern you.
